# Patient Record
Sex: Male | Born: 1975 | Hispanic: Yes | Marital: Single | Smoking: Former smoker
Health system: Southern US, Community
[De-identification: ages and names within clinical notes are randomized; demographics above are authoritative.]

## PROBLEM LIST (undated history)

## (undated) DIAGNOSIS — I10 Essential (primary) hypertension: Secondary | ICD-10-CM

---

## 2019-11-29 ENCOUNTER — Encounter: Payer: Self-pay | Admitting: Emergency Medicine

## 2019-11-29 ENCOUNTER — Emergency Department: Payer: Managed Care, Other (non HMO)

## 2019-11-29 ENCOUNTER — Emergency Department
Admission: EM | Admit: 2019-11-29 | Discharge: 2019-11-29 | Disposition: A | Discharge status: Home / Self Care | Payer: Managed Care, Other (non HMO) | Attending: Student in an Organized Health Care Education/Training Program | Admitting: Student in an Organized Health Care Education/Training Program

## 2019-11-29 ENCOUNTER — Other Ambulatory Visit: Payer: Self-pay

## 2019-11-29 DIAGNOSIS — I10 Essential (primary) hypertension: Secondary | ICD-10-CM | POA: Insufficient documentation

## 2019-11-29 DIAGNOSIS — R002 Palpitations: Secondary | ICD-10-CM

## 2019-11-29 DIAGNOSIS — Z87891 Personal history of nicotine dependence: Secondary | ICD-10-CM | POA: Diagnosis not present

## 2019-11-29 HISTORY — DX: Essential (primary) hypertension: I10

## 2019-11-29 LAB — CBC
HCT: 47.5 % (ref 39.0–52.0)
Hemoglobin: 16.7 g/dL (ref 13.0–17.0)
MCH: 31.1 pg (ref 26.0–34.0)
MCHC: 35.2 g/dL (ref 30.0–36.0)
MCV: 88.5 fL (ref 80.0–100.0)
Platelets: 217 10*3/uL (ref 150–400)
RBC: 5.37 MIL/uL (ref 4.22–5.81)
RDW: 12.3 % (ref 11.5–15.5)
WBC: 9.8 10*3/uL (ref 4.0–10.5)
nRBC: 0 % (ref 0.0–0.2)

## 2019-11-29 LAB — BASIC METABOLIC PANEL
Anion gap: 8 (ref 5–15)
BUN: 17 mg/dL (ref 6–20)
CO2: 26 mmol/L (ref 22–32)
Calcium: 9.2 mg/dL (ref 8.9–10.3)
Chloride: 100 mmol/L (ref 98–111)
Creatinine, Ser: 0.8 mg/dL (ref 0.61–1.24)
GFR, Estimated: >60 mL/min (ref >60)
Glucose, Bld: 112 mg/dL — ABNORMAL HIGH (ref 70–99)
Potassium: 3.2 mmol/L — ABNORMAL LOW (ref 3.5–5.1)
Sodium: 134 mmol/L — ABNORMAL LOW (ref 135–145)

## 2019-11-29 LAB — TROPONIN I (HIGH SENSITIVITY)
Troponin I (High Sensitivity): 3 ng/L (ref <18)
Troponin I (High Sensitivity): 4 ng/L (ref <18)

## 2019-11-29 MED ORDER — POTASSIUM CHLORIDE ER 10 MEQ PO TBCR: 10.0000 meq | EXTENDED_RELEASE_TABLET | Freq: Every day | ORAL | 0 refills | Status: AC

## 2019-11-29 MED ORDER — POTASSIUM CHLORIDE CRYS ER 20 MEQ PO TBCR
40.0000 meq | EXTENDED_RELEASE_TABLET | Freq: Once | ORAL | Status: AC
  Administered 2019-11-29: 40 meq via ORAL
  Filled 2019-11-29: qty 2

## 2019-11-29 NOTE — ED Triage Notes (Signed)
Pt presents to ED via POV with c/o intermittent however worsening palpitations x 2 weeks. Pt states initially it was intermittent feeling like his heart was skipping beats, now frequency has increased and pt states feels constantly like his heart is skipping beats, denies any pain, A&O x4, NAD noted in triage.

## 2019-11-29 NOTE — ED Provider Notes (Signed)
[facility] Emergency Department Provider Note    First MD Initiated Contact with Patient 11/29/19 2012     (approximate)  I have reviewed the triage vital signs and the nursing notes.   HISTORY  Chief Complaint Palpitations    HPI Charles Mercado is a 44 y.o. male   with a history of hypertension on lisinopril presents to the ER for evaluation of 1 to 2 weeks of intermittent palpitations where he feels like his heart is skipping several beats in a row.  Never feels any pain or pressure.  Does not feel like he is about to pass out.  No associated diaphoresis.  No recent medication changes.  He has been on lisinopril.  Does have some stress associated with work but nothing of the ordinary.  Does drink coffee daily.  No significant alcohol use.  Does not smoke.  No previous heart disease.   Past Medical History:  Diagnosis Date  . Hypertension    No family history on file.  There are no problems to display for this patient.     Prior to Admission medications   Medication Sig Start Date End Date Taking? Authorizing Provider  potassium chloride (KLOR-CON) 10 MEQ tablet Take 1 tablet (10 mEq total) by mouth daily for 5 days. 11/29/19 12/04/19  Merlyn Lot, MD    Allergies Patient has no known allergies.    Social History Social History   Tobacco Use  . Smoking status: Former Research scientist (life sciences)  . Smokeless tobacco: Never Used  Substance Use Topics  . Alcohol use: Yes    Comment: Sociall  . Drug use: Not Currently    Review of Systems Patient denies headaches, rhinorrhea, blurry vision, numbness, shortness of breath, chest pain, edema, cough, abdominal pain, nausea, vomiting, diarrhea, dysuria, fevers, rashes or hallucinations unless otherwise stated above in HPI. ____________________________________________   PHYSICAL EXAM:  VITAL SIGNS: Vitals:   11/29/19 1611 11/29/19 1841  BP: 139/79 113/65  Pulse: 88 70  Resp: 18 18  Temp: 99.1 F (37.3  C)   SpO2: 94% 99%    Constitutional: Alert and oriented.  Eyes: Conjunctivae are normal.  Head: Atraumatic. Nose: No congestion/rhinnorhea. Mouth/Throat: Mucous membranes are moist.   Neck: No stridor. Painless ROM.  Cardiovascular: Normal rate, regular rhythm. Grossly normal heart sounds.  Good peripheral circulation. Respiratory: Normal respiratory effort.  No retractions. Lungs CTAB. Gastrointestinal: Soft and nontender. No distention. No abdominal bruits. No CVA tenderness. Genitourinary:  Musculoskeletal: No lower extremity tenderness nor edema.  No joint effusions. Neurologic:  Normal speech and language. No gross focal neurologic deficits are appreciated. No facial droop Skin:  Skin is warm, dry and intact. No rash noted. Psychiatric: Mood and affect are normal. Speech and behavior are normal.  ____________________________________________   LABS (all labs ordered are listed, but only abnormal results are displayed)  Results for orders placed or performed during the hospital encounter of 11/29/19 (from the past 24 hour(s))  Basic metabolic panel     Status: Abnormal   Collection Time: 11/29/19  4:15 PM  Result Value Ref Range   Sodium 134 (L) 135 - 145 mmol/L   Potassium 3.2 (L) 3.5 - 5.1 mmol/L   Chloride 100 98 - 111 mmol/L   CO2 26 22 - 32 mmol/L   Glucose, Bld 112 (H) 70 - 99 mg/dL   BUN 17 6 - 20 mg/dL   Creatinine, Ser 0.80 0.61 - 1.24 mg/dL   Calcium 9.2 8.9 - 10.3 mg/dL  GFR, Estimated >60 >60 mL/min   Anion gap 8 5 - 15  CBC     Status: None   Collection Time: 11/29/19  4:15 PM  Result Value Ref Range   WBC 9.8 4.0 - 10.5 K/uL   RBC 5.37 4.22 - 5.81 MIL/uL   Hemoglobin 16.7 13.0 - 17.0 g/dL   HCT 47.5 39 - 52 %   MCV 88.5 80.0 - 100.0 fL   MCH 31.1 26.0 - 34.0 pg   MCHC 35.2 30.0 - 36.0 g/dL   RDW 12.3 11.5 - 15.5 %   Platelets 217 150 - 400 K/uL   nRBC 0.0 0.0 - 0.2 %  Troponin I (High Sensitivity)     Status: None   Collection Time: 11/29/19   4:15 PM  Result Value Ref Range   Troponin I (High Sensitivity) 3 <18 ng/L  Troponin I (High Sensitivity)     Status: None   Collection Time: 11/29/19  6:41 PM  Result Value Ref Range   Troponin I (High Sensitivity) 4 <18 ng/L   ____________________________________________  EKG My review and personal interpretation at Time: 16:06   Indication: palpitations  Rate: 90  Rhythm: sinus with occasional pac Axis: normal Other: normal intervals, no stemi, no wpw or brugada ____________________________________________  RADIOLOGY  I personally reviewed all radiographic images ordered to evaluate for the above acute complaints and reviewed radiology reports and findings.  These findings were personally discussed with the patient.  Please see medical record for radiology report.  ____________________________________________   PROCEDURES  Procedure(s) performed:  Procedures    Critical Care performed: no ____________________________________________   INITIAL IMPRESSION / ASSESSMENT AND PLAN / ED COURSE  Pertinent labs & imaging results that were available during my care of the patient were reviewed by me and considered in my medical decision making (see chart for details).   DDX: Dysrhythmia, electrolyte abnormality, dehydration, ACS  Charles Mercado is a 44 y.o. who presents to the ED with presentation as described above.  Patient very well-appearing clinically.  Nontoxic.  Negative troponin profile is not consistent with ACS or ischemia.  Mild hypokalemia which were repleted orally.  Patient having occasional PACs.  No prolonged episodes or near syncopal episodes.  May be having symptomatic palpitations secondary to PACs but will be referred to cardiology for further work-up and management.  Will order a brief course of potassium repletion.  Discussed need for follow-up with PCP for repeat blood check.  Have discussed with the patient and available family all diagnostics and treatments  performed thus far and all questions were answered to the best of my ability. The patient demonstrates understanding and agreement with plan.      The patient was evaluated in Emergency Department today for the symptoms described in the history of present illness. He/she was evaluated in the context of the global COVID-19 pandemic, which necessitated consideration that the patient might be at risk for infection with the SARS-CoV-2 virus that causes COVID-19. Institutional protocols and algorithms that pertain to the evaluation of patients at risk for COVID-19 are in a state of rapid change based on information released by regulatory bodies including the CDC and federal and state organizations. These policies and algorithms were followed during the patient's care in the ED.  As part of my medical decision making, I reviewed the following data within the [facility] notes reviewed and incorporated, Labs reviewed, notes from prior ED visits and [place] Controlled Substance Database   ____________________________________________  FINAL CLINICAL IMPRESSION(S) / ED DIAGNOSES  Final diagnoses:  Palpitations      NEW MEDICATIONS STARTED DURING THIS VISIT:  New Prescriptions   POTASSIUM CHLORIDE (KLOR-CON) 10 MEQ TABLET    Take 1 tablet (10 mEq total) by mouth daily for 5 days.     Note:  This document was prepared using Dragon voice recognition software and may include unintentional dictation errors.    Merlyn Lot, MD 11/29/19 2050

## 2020-12-21 ENCOUNTER — Ambulatory Visit: Payer: Managed Care, Other (non HMO) | Admitting: Urology

## 2020-12-21 ENCOUNTER — Ambulatory Visit
Admission: RE | Admit: 2020-12-21 | Discharge: 2020-12-21 | Disposition: A | Discharge status: Home / Self Care | Payer: Managed Care, Other (non HMO) | Source: Ambulatory Visit | Attending: Urology | Admitting: Urology

## 2020-12-21 ENCOUNTER — Other Ambulatory Visit: Payer: Self-pay

## 2020-12-21 ENCOUNTER — Encounter: Payer: Self-pay | Admitting: Urology

## 2020-12-21 VITALS — BP 130/80 | HR 102 | Ht 64.0 in | Wt 201.0 lb

## 2020-12-21 DIAGNOSIS — N200 Calculus of kidney: Secondary | ICD-10-CM | POA: Diagnosis present

## 2020-12-21 LAB — URINALYSIS, COMPLETE
Bilirubin, UA: NEGATIVE
Glucose, UA: NEGATIVE
Ketones, UA: NEGATIVE
Leukocytes,UA: NEGATIVE
Nitrite, UA: NEGATIVE
Protein,UA: NEGATIVE
RBC, UA: NEGATIVE
Specific Gravity, UA: 1.015 (ref 1.005–1.030)
Urobilinogen, Ur: 0.2 mg/dL (ref 0.2–1.0)
pH, UA: 6.5 (ref 5.0–7.5)

## 2020-12-21 LAB — MICROSCOPIC EXAMINATION
Bacteria, UA: NONE SEEN
Epithelial Cells (non renal): NONE SEEN /hpf (ref 0–10)

## 2020-12-21 NOTE — Progress Notes (Signed)
   12/21/2020 1:59 PM   Charles Mercado Sep 07, 1975 [id number]  Referring provider: Leonel Ramsay, MD [place],  [place] [address]  Chief Complaint  Patient presents with   Nephrolithiasis    HPI: Charles Mercado is a 45 y.o. male who presents for evaluation of nephrolithiasis.  Presented to [facility] ED 12/13/2020 with cute onset of right flank pain radiation to the right pelvic region Pain intermittently severe without identifiable precipitating, aggravating or alleviating factors Stone protocol CT abdomen pelvis performed which showed a 1 mm right UVJ calculus with mild hydronephrosis and a nonobstructing 1 mm left renal calculus Denies previous history of stone disease, chronic bowel disease Shortly after his ED visit he passed a small black speck and had resolution of his right-sided symptoms No complaints today   PMH: Past Medical History:  Diagnosis Date   Hypertension     Surgical History: None   Home Medications:  Allergies as of 12/21/2020   No Known Allergies      Medication List        Accurate as of December 21, 2020  1:59 PM. If you have any questions, ask your nurse or doctor.          lisinopril-hydrochlorothiazide 20-25 MG tablet Commonly known as: ZESTORETIC Take 1 tablet by mouth daily.   potassium chloride 10 MEQ tablet Commonly known as: KLOR-CON Take 1 tablet (10 mEq total) by mouth daily for 5 days.   tamsulosin 0.4 MG Caps capsule Commonly known as: FLOMAX Take 0.4 mg by mouth daily.        Allergies: No Known Allergies  Family History: No family history on file.  Social History:  reports that he has quit smoking. He has never used smokeless tobacco. He reports current alcohol use. He reports that he does not currently use drugs.   Physical Exam: BP 130/80   Pulse (!) 102   Ht 5' 4"  (1.626 m)   Wt 201 lb (91.2 kg)   BMI 34.50 kg/m   Constitutional:  Alert and oriented, No acute  distress. HEENT: Sc[place] AT, moist mucus membranes.  Trachea midline, no masses. Cardiovascular: No clubbing, cyanosis, or edema. Respiratory: Normal respiratory effort, no increased work of breathing. Psychiatric: Normal mood and affect.   Assessment & Plan:    1.  Right ureteral calculus Passed calculus with resolution of symptoms  2.  Left nephrolithiasis KUB today Metabolic evaluation recommended via [place] and he desires to proceed He will be notified with results Follow-up 1 year with KUB or earlier for recurrent stone symptoms   Abbie Sons, MD  [facility] [address] [place], [place] [address] [phone]

## 2020-12-21 NOTE — Patient Instructions (Signed)

## 2020-12-24 ENCOUNTER — Encounter: Payer: Self-pay | Admitting: Urology

## 2020-12-25 ENCOUNTER — Encounter: Payer: Self-pay | Admitting: *Deleted

## 2021-02-12 ENCOUNTER — Other Ambulatory Visit: Payer: Self-pay

## 2021-02-12 ENCOUNTER — Other Ambulatory Visit: Payer: Managed Care, Other (non HMO)

## 2021-02-27 ENCOUNTER — Other Ambulatory Visit: Payer: Self-pay | Admitting: Urology

## 2021-12-19 ENCOUNTER — Telehealth: Payer: Self-pay | Admitting: Urology

## 2021-12-19 NOTE — Telephone Encounter (Signed)
Pt's pcp ordered KUB 9/25 and he wants to know if it's necessary to have another.  He is scheduled for this Friday, but will need to r/s appt, due to his work schedule.  Please let me know if he will need KUB prior.

## 2021-12-19 NOTE — Telephone Encounter (Signed)
Don't see kub in system

## 2021-12-21 ENCOUNTER — Ambulatory Visit: Payer: Managed Care, Other (non HMO) | Admitting: Urology

## 2021-12-24 ENCOUNTER — Telehealth: Payer: Self-pay | Admitting: Urology

## 2021-12-24 NOTE — Telephone Encounter (Signed)
Patient dropped off KUB disc. Placed disc on Dr. Heywood Footman desk.

## 2023-01-08 IMAGING — CR DG ABDOMEN 1V
1 series · 2 of 2 positions shown · non-contrast
Comparison: None.

CLINICAL DATA: Evaluate for renal stones.

EXAM:
ABDOMEN - 1 VIEW

[Series 1: dg abd 1 view · 0.14mm/px · 2 of 2 slices shown]
[im 1/2]
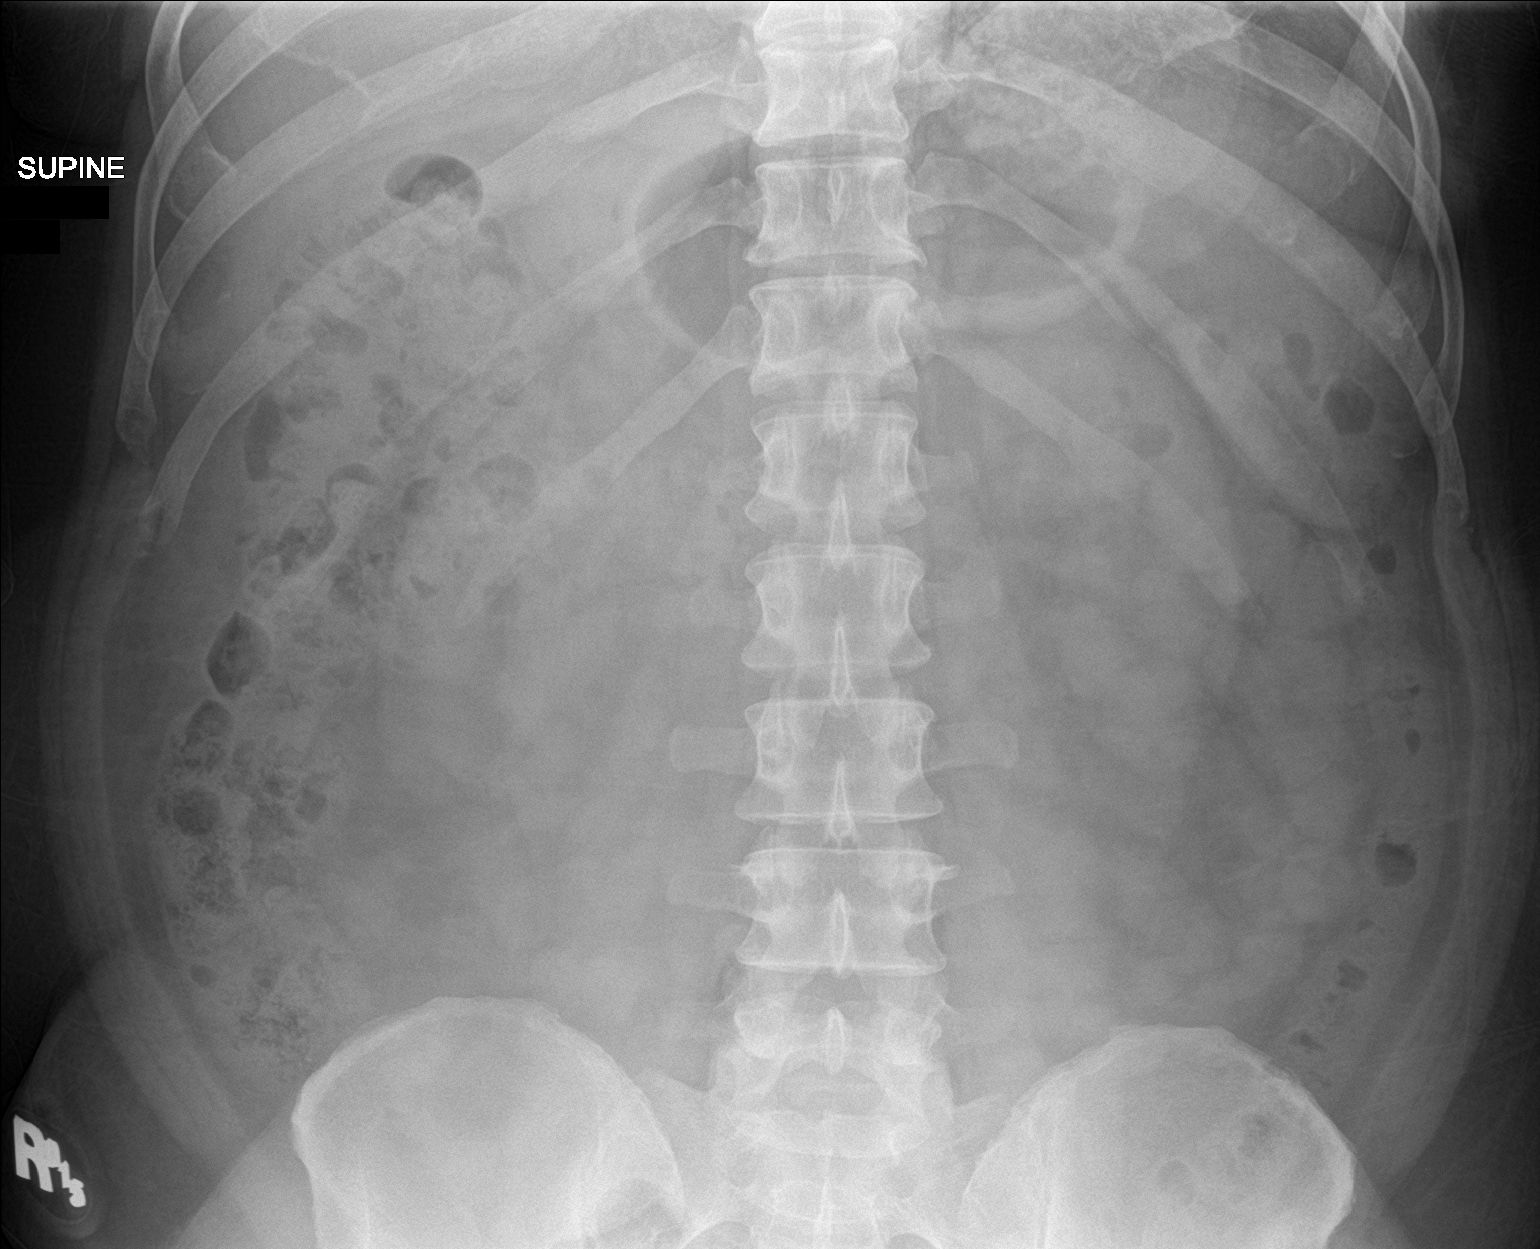
[im 2/2]
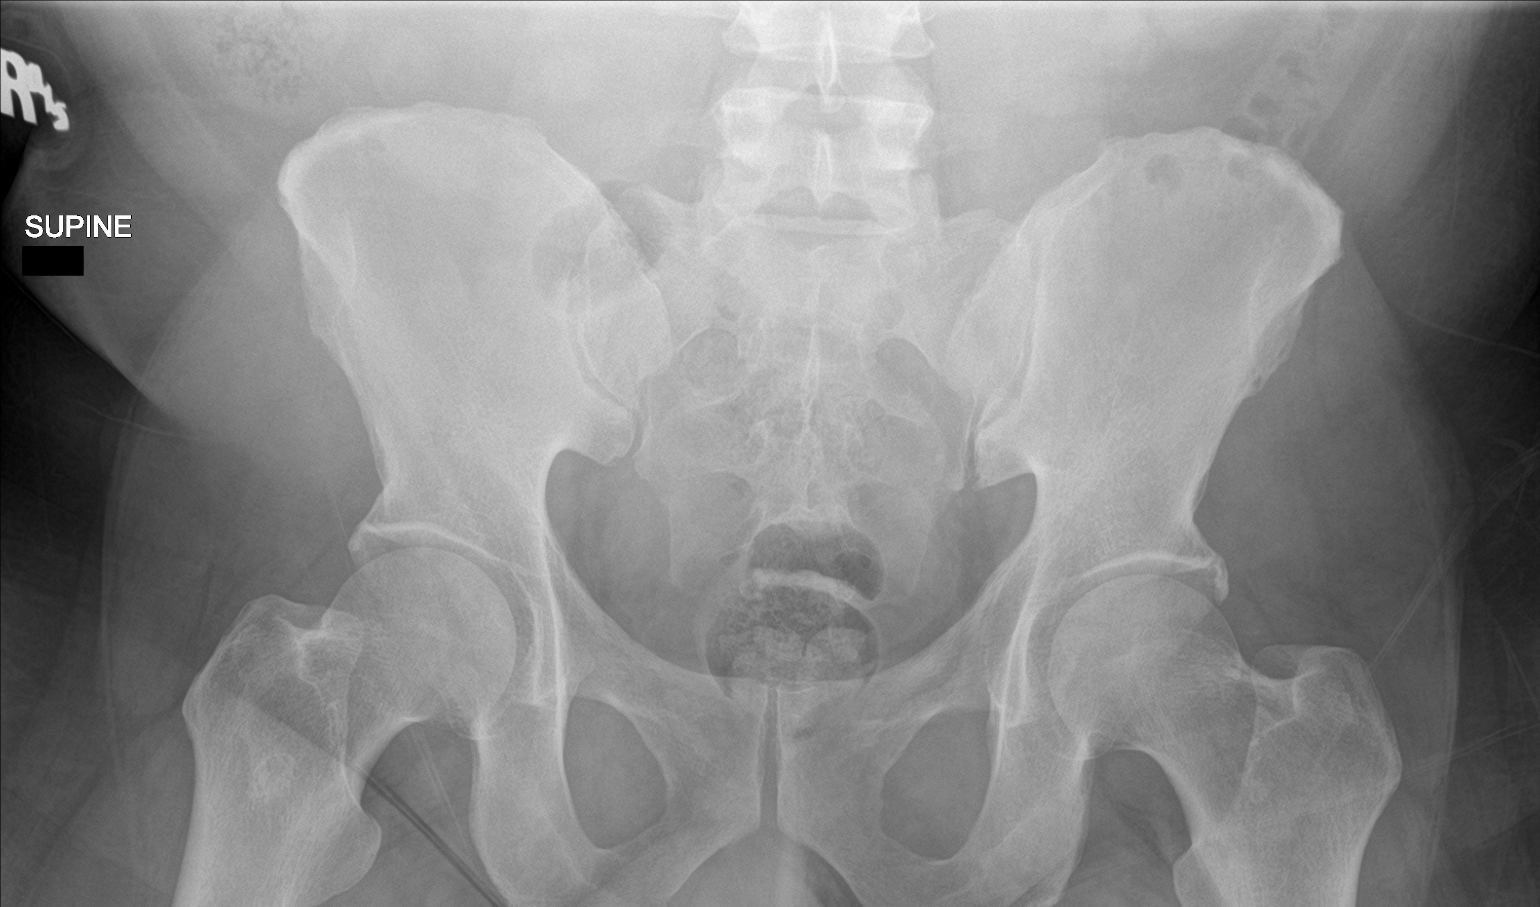

[2 of 2 positions shown; findings below may reference images not displayed]

FINDINGS: The bowel gas pattern is normal. A 2 mm soft tissue calcification is
seen projecting over the upper pole of the left kidney.
IMPRESSION: 2 mm renal calculus suspected within the upper pole of the left
kidney.
